# Patient Record
Sex: Male | Born: 1980 | Hispanic: Yes | Smoking: Former smoker
Health system: Southern US, Community
[De-identification: ages and names within clinical notes are randomized; demographics above are authoritative.]

## PROBLEM LIST (undated history)

## (undated) DIAGNOSIS — F1020 Alcohol dependence, uncomplicated: Secondary | ICD-10-CM

---

## 2021-01-29 DIAGNOSIS — Y908 Blood alcohol level of 240 mg/100 ml or more: Secondary | ICD-10-CM | POA: Insufficient documentation

## 2021-01-29 DIAGNOSIS — M542 Cervicalgia: Secondary | ICD-10-CM | POA: Insufficient documentation

## 2021-01-29 DIAGNOSIS — R002 Palpitations: Secondary | ICD-10-CM | POA: Insufficient documentation

## 2021-01-29 DIAGNOSIS — G8929 Other chronic pain: Secondary | ICD-10-CM | POA: Insufficient documentation

## 2021-04-06 DIAGNOSIS — F10920 Alcohol use, unspecified with intoxication, uncomplicated: Secondary | ICD-10-CM | POA: Insufficient documentation

## 2021-04-06 DIAGNOSIS — Z79899 Other long term (current) drug therapy: Secondary | ICD-10-CM | POA: Insufficient documentation

## 2021-04-06 DIAGNOSIS — R079 Chest pain, unspecified: Secondary | ICD-10-CM | POA: Insufficient documentation

## 2021-04-06 DIAGNOSIS — E119 Type 2 diabetes mellitus without complications: Secondary | ICD-10-CM | POA: Insufficient documentation

## 2021-04-06 DIAGNOSIS — E860 Dehydration: Secondary | ICD-10-CM

## 2021-04-06 DIAGNOSIS — Z20822 Contact with and (suspected) exposure to covid-19: Secondary | ICD-10-CM | POA: Insufficient documentation

## 2021-04-06 DIAGNOSIS — R1013 Epigastric pain: Secondary | ICD-10-CM | POA: Insufficient documentation

## 2021-04-06 DIAGNOSIS — Z7984 Long term (current) use of oral hypoglycemic drugs: Secondary | ICD-10-CM | POA: Insufficient documentation

## 2021-04-06 DIAGNOSIS — R630 Anorexia: Secondary | ICD-10-CM | POA: Insufficient documentation

## 2021-04-06 DIAGNOSIS — F1010 Alcohol abuse, uncomplicated: Secondary | ICD-10-CM | POA: Insufficient documentation

## 2021-04-06 DIAGNOSIS — R112 Nausea with vomiting, unspecified: Secondary | ICD-10-CM | POA: Insufficient documentation

## 2021-04-06 DIAGNOSIS — H11433 Conjunctival hyperemia, bilateral: Secondary | ICD-10-CM | POA: Insufficient documentation

## 2021-04-06 DIAGNOSIS — Y908 Blood alcohol level of 240 mg/100 ml or more: Secondary | ICD-10-CM | POA: Insufficient documentation

## 2021-04-06 DIAGNOSIS — R000 Tachycardia, unspecified: Secondary | ICD-10-CM | POA: Insufficient documentation

## 2021-04-06 HISTORY — DX: Alcohol dependence, uncomplicated: F10.20

## 2021-04-06 MED ORDER — ONDANSETRON HCL 4 MG/2ML IJ SOLN
4.0000 mg | Freq: Once | INTRAMUSCULAR | Status: AC
  Administered 2021-04-06: 4 mg via INTRAVENOUS

## 2021-04-06 MED ORDER — DEXTROSE-NACL 5-0.45 % IV SOLN: INTRAVENOUS | Status: AC

## 2021-04-06 MED ORDER — SODIUM CHLORIDE 0.9 % IV BOLUS: 1000.0000 mL | Freq: Once | INTRAVENOUS | Status: DC

## 2021-04-06 MED ADMIN — Famotidine in NaCl 0.9% IV Soln 20 MG/50ML: 20 mg | INTRAVENOUS | NDC 00338519741

## 2021-04-07 MED ORDER — ONDANSETRON HCL 4 MG/2ML IJ SOLN
4.0000 mg | Freq: Once | INTRAMUSCULAR | Status: AC
  Administered 2021-04-07: 4 mg via INTRAVENOUS

## 2021-04-07 MED ORDER — METFORMIN HCL 500 MG PO TABS: 500.0000 mg | ORAL_TABLET | Freq: Two times a day (BID) | ORAL | 0 refills | Status: DC

## 2021-04-07 MED ADMIN — Alum & Mag Hydroxide-Simethicone Susp 200-200-20 MG/5ML: 30 mL | ORAL | NDC 00121176130

## 2021-04-07 MED ADMIN — Sodium Chloride IV Soln 0.9%: 500 mL | INTRAVENOUS | NDC 00338004903

## 2021-07-26 DIAGNOSIS — R0683 Snoring: Secondary | ICD-10-CM | POA: Insufficient documentation

## 2021-08-10 DIAGNOSIS — Z5321 Procedure and treatment not carried out due to patient leaving prior to being seen by health care provider: Secondary | ICD-10-CM | POA: Insufficient documentation

## 2021-08-10 DIAGNOSIS — Z7984 Long term (current) use of oral hypoglycemic drugs: Secondary | ICD-10-CM | POA: Insufficient documentation

## 2021-08-10 DIAGNOSIS — F10920 Alcohol use, unspecified with intoxication, uncomplicated: Secondary | ICD-10-CM

## 2021-08-10 DIAGNOSIS — R079 Chest pain, unspecified: Secondary | ICD-10-CM | POA: Insufficient documentation

## 2021-08-10 DIAGNOSIS — F10120 Alcohol abuse with intoxication, uncomplicated: Secondary | ICD-10-CM | POA: Insufficient documentation

## 2021-08-10 DIAGNOSIS — R519 Headache, unspecified: Secondary | ICD-10-CM | POA: Insufficient documentation

## 2021-08-10 DIAGNOSIS — R109 Unspecified abdominal pain: Secondary | ICD-10-CM | POA: Insufficient documentation

## 2021-08-10 DIAGNOSIS — Y908 Blood alcohol level of 240 mg/100 ml or more: Secondary | ICD-10-CM | POA: Insufficient documentation

## 2021-08-10 DIAGNOSIS — Z79899 Other long term (current) drug therapy: Secondary | ICD-10-CM | POA: Insufficient documentation

## 2021-08-10 DIAGNOSIS — R0789 Other chest pain: Secondary | ICD-10-CM | POA: Insufficient documentation

## 2021-08-10 DIAGNOSIS — E119 Type 2 diabetes mellitus without complications: Secondary | ICD-10-CM | POA: Insufficient documentation

## 2021-08-10 DIAGNOSIS — R000 Tachycardia, unspecified: Secondary | ICD-10-CM | POA: Insufficient documentation

## 2021-08-10 DIAGNOSIS — I10 Essential (primary) hypertension: Secondary | ICD-10-CM | POA: Insufficient documentation

## 2021-08-10 DIAGNOSIS — R45851 Suicidal ideations: Secondary | ICD-10-CM | POA: Insufficient documentation

## 2021-08-10 DIAGNOSIS — F1010 Alcohol abuse, uncomplicated: Secondary | ICD-10-CM

## 2021-08-10 MED ORDER — SODIUM CHLORIDE 0.9 % IV BOLUS
1000.0000 mL | Freq: Once | INTRAVENOUS | Status: AC
  Administered 2021-08-10: 1000 mL via INTRAVENOUS

## 2021-08-10 MED ORDER — CHLORDIAZEPOXIDE HCL 25 MG PO CAPS: ORAL_CAPSULE | ORAL | 0 refills | Status: DC

## 2021-08-11 DIAGNOSIS — Y907 Blood alcohol level of 200-239 mg/100 ml: Secondary | ICD-10-CM | POA: Insufficient documentation

## 2021-08-11 DIAGNOSIS — F10120 Alcohol abuse with intoxication, uncomplicated: Secondary | ICD-10-CM | POA: Insufficient documentation

## 2021-08-11 DIAGNOSIS — F10920 Alcohol use, unspecified with intoxication, uncomplicated: Secondary | ICD-10-CM | POA: Insufficient documentation

## 2021-08-11 DIAGNOSIS — Z79899 Other long term (current) drug therapy: Secondary | ICD-10-CM | POA: Insufficient documentation

## 2021-08-11 DIAGNOSIS — Y908 Blood alcohol level of 240 mg/100 ml or more: Secondary | ICD-10-CM | POA: Insufficient documentation

## 2021-08-11 DIAGNOSIS — F32A Depression, unspecified: Secondary | ICD-10-CM | POA: Insufficient documentation

## 2021-08-11 DIAGNOSIS — Z87891 Personal history of nicotine dependence: Secondary | ICD-10-CM | POA: Insufficient documentation

## 2021-08-11 DIAGNOSIS — F1010 Alcohol abuse, uncomplicated: Secondary | ICD-10-CM

## 2021-08-11 DIAGNOSIS — Z7984 Long term (current) use of oral hypoglycemic drugs: Secondary | ICD-10-CM | POA: Insufficient documentation

## 2021-08-11 MED ORDER — ADULT MULTIVITAMIN W/MINERALS CH
1.0000 | ORAL_TABLET | Freq: Once | ORAL | Status: AC
  Administered 2021-08-11: 1 via ORAL

## 2021-08-11 MED ORDER — SODIUM CHLORIDE 0.9 % IV BOLUS
1000.0000 mL | Freq: Once | INTRAVENOUS | Status: AC
  Administered 2021-08-11: 1000 mL via INTRAVENOUS

## 2021-08-11 MED ORDER — ONDANSETRON 4 MG PO TBDP
8.0000 mg | ORAL_TABLET | Freq: Once | ORAL | Status: AC
  Administered 2021-08-11: 8 mg via ORAL

## 2021-08-11 MED ORDER — THIAMINE HCL 100 MG/ML IJ SOLN
100.0000 mg | Freq: Once | INTRAMUSCULAR | Status: AC
  Administered 2021-08-11: 100 mg via INTRAVENOUS

## 2021-08-11 MED ORDER — PANTOPRAZOLE SODIUM 40 MG IV SOLR
40.0000 mg | Freq: Once | INTRAVENOUS | Status: AC
  Administered 2021-08-11: 40 mg via INTRAVENOUS

## 2021-08-11 MED ORDER — ONDANSETRON HCL 4 MG/2ML IJ SOLN
4.0000 mg | Freq: Once | INTRAMUSCULAR | Status: AC
  Administered 2021-08-11: 4 mg via INTRAVENOUS

## 2021-10-16 DIAGNOSIS — G4733 Obstructive sleep apnea (adult) (pediatric): Secondary | ICD-10-CM | POA: Insufficient documentation

## 2021-10-16 DIAGNOSIS — R0683 Snoring: Secondary | ICD-10-CM | POA: Insufficient documentation

## 2021-10-24 DIAGNOSIS — R0683 Snoring: Secondary | ICD-10-CM

## 2021-11-02 DIAGNOSIS — G4733 Obstructive sleep apnea (adult) (pediatric): Secondary | ICD-10-CM | POA: Insufficient documentation

## 2021-11-02 DIAGNOSIS — Z7984 Long term (current) use of oral hypoglycemic drugs: Secondary | ICD-10-CM | POA: Insufficient documentation

## 2021-11-02 DIAGNOSIS — N50812 Left testicular pain: Secondary | ICD-10-CM | POA: Insufficient documentation

## 2021-11-02 DIAGNOSIS — E119 Type 2 diabetes mellitus without complications: Secondary | ICD-10-CM | POA: Insufficient documentation

## 2021-11-02 MED ORDER — HYDROCODONE-ACETAMINOPHEN 5-325 MG PO TABS: 1.0000 | ORAL_TABLET | ORAL | 0 refills | Status: AC | PRN

## 2022-01-02 DIAGNOSIS — G4734 Idiopathic sleep related nonobstructive alveolar hypoventilation: Secondary | ICD-10-CM | POA: Insufficient documentation

## 2022-01-02 DIAGNOSIS — G4733 Obstructive sleep apnea (adult) (pediatric): Secondary | ICD-10-CM

## 2022-06-28 DIAGNOSIS — G4733 Obstructive sleep apnea (adult) (pediatric): Secondary | ICD-10-CM

## 2022-06-28 DIAGNOSIS — G4734 Idiopathic sleep related nonobstructive alveolar hypoventilation: Secondary | ICD-10-CM

## 2022-07-16 DIAGNOSIS — G4733 Obstructive sleep apnea (adult) (pediatric): Secondary | ICD-10-CM

## 2022-07-16 DIAGNOSIS — G4734 Idiopathic sleep related nonobstructive alveolar hypoventilation: Secondary | ICD-10-CM

## 2022-07-18 DIAGNOSIS — G4733 Obstructive sleep apnea (adult) (pediatric): Secondary | ICD-10-CM | POA: Insufficient documentation

## 2022-07-18 DIAGNOSIS — G4734 Idiopathic sleep related nonobstructive alveolar hypoventilation: Secondary | ICD-10-CM | POA: Insufficient documentation

## 2022-07-20 DIAGNOSIS — G4733 Obstructive sleep apnea (adult) (pediatric): Secondary | ICD-10-CM

## 2023-01-15 DIAGNOSIS — G4734 Idiopathic sleep related nonobstructive alveolar hypoventilation: Secondary | ICD-10-CM

## 2023-01-15 DIAGNOSIS — J45909 Unspecified asthma, uncomplicated: Secondary | ICD-10-CM | POA: Insufficient documentation

## 2023-01-15 DIAGNOSIS — G4733 Obstructive sleep apnea (adult) (pediatric): Secondary | ICD-10-CM

## 2023-01-15 MED ORDER — ALBUTEROL SULFATE HFA 108 (90 BASE) MCG/ACT IN AERS: 2.0000 | INHALATION_SPRAY | Freq: Four times a day (QID) | RESPIRATORY_TRACT | 2 refills | Status: AC | PRN

## 2023-01-15 MED ORDER — LEVOCETIRIZINE DIHYDROCHLORIDE 5 MG PO TABS: 5.0000 mg | ORAL_TABLET | Freq: Every evening | ORAL | 5 refills | Status: DC

## 2023-03-06 DIAGNOSIS — K2920 Alcoholic gastritis without bleeding: Secondary | ICD-10-CM | POA: Insufficient documentation

## 2023-03-06 DIAGNOSIS — Z87891 Personal history of nicotine dependence: Secondary | ICD-10-CM | POA: Insufficient documentation

## 2023-03-06 DIAGNOSIS — F1010 Alcohol abuse, uncomplicated: Secondary | ICD-10-CM | POA: Insufficient documentation

## 2023-03-06 DIAGNOSIS — Y902 Blood alcohol level of 40-59 mg/100 ml: Secondary | ICD-10-CM | POA: Insufficient documentation

## 2023-03-06 DIAGNOSIS — J45909 Unspecified asthma, uncomplicated: Secondary | ICD-10-CM | POA: Insufficient documentation

## 2023-03-06 MED ORDER — PANTOPRAZOLE SODIUM 20 MG PO TBEC: 20.0000 mg | DELAYED_RELEASE_TABLET | Freq: Every day | ORAL | 0 refills | Status: AC

## 2023-03-06 MED ORDER — NALTREXONE HCL 50 MG PO TABS: 50.0000 mg | ORAL_TABLET | Freq: Every day | ORAL | 0 refills | Status: AC

## 2023-03-06 MED ORDER — PHENOBARBITAL 16.2 MG PO TABS
16.2000 mg | ORAL_TABLET | Freq: Once | ORAL | Status: AC
  Administered 2023-03-07: 16.2 mg via ORAL

## 2023-03-06 MED ADMIN — Alum & Mag Hydroxide-Simethicone Susp 200-200-20 MG/5ML: 15 mL | ORAL | NDC 63739015910

## 2023-03-06 MED ADMIN — Pantoprazole Sodium EC Tab 40 MG (Base Equiv): 40 mg | ORAL | NDC 35573042851

## 2023-06-15 IMAGING — DX DG CHEST 1V PORT
1 series · 1 of 1 positions shown · non-contrast
Comparison: Chest radiographs 01/29/2021.

CLINICAL DATA: 40-year-old male with chest pain.

EXAM:
PORTABLE CHEST 1 VIEW

[chest]
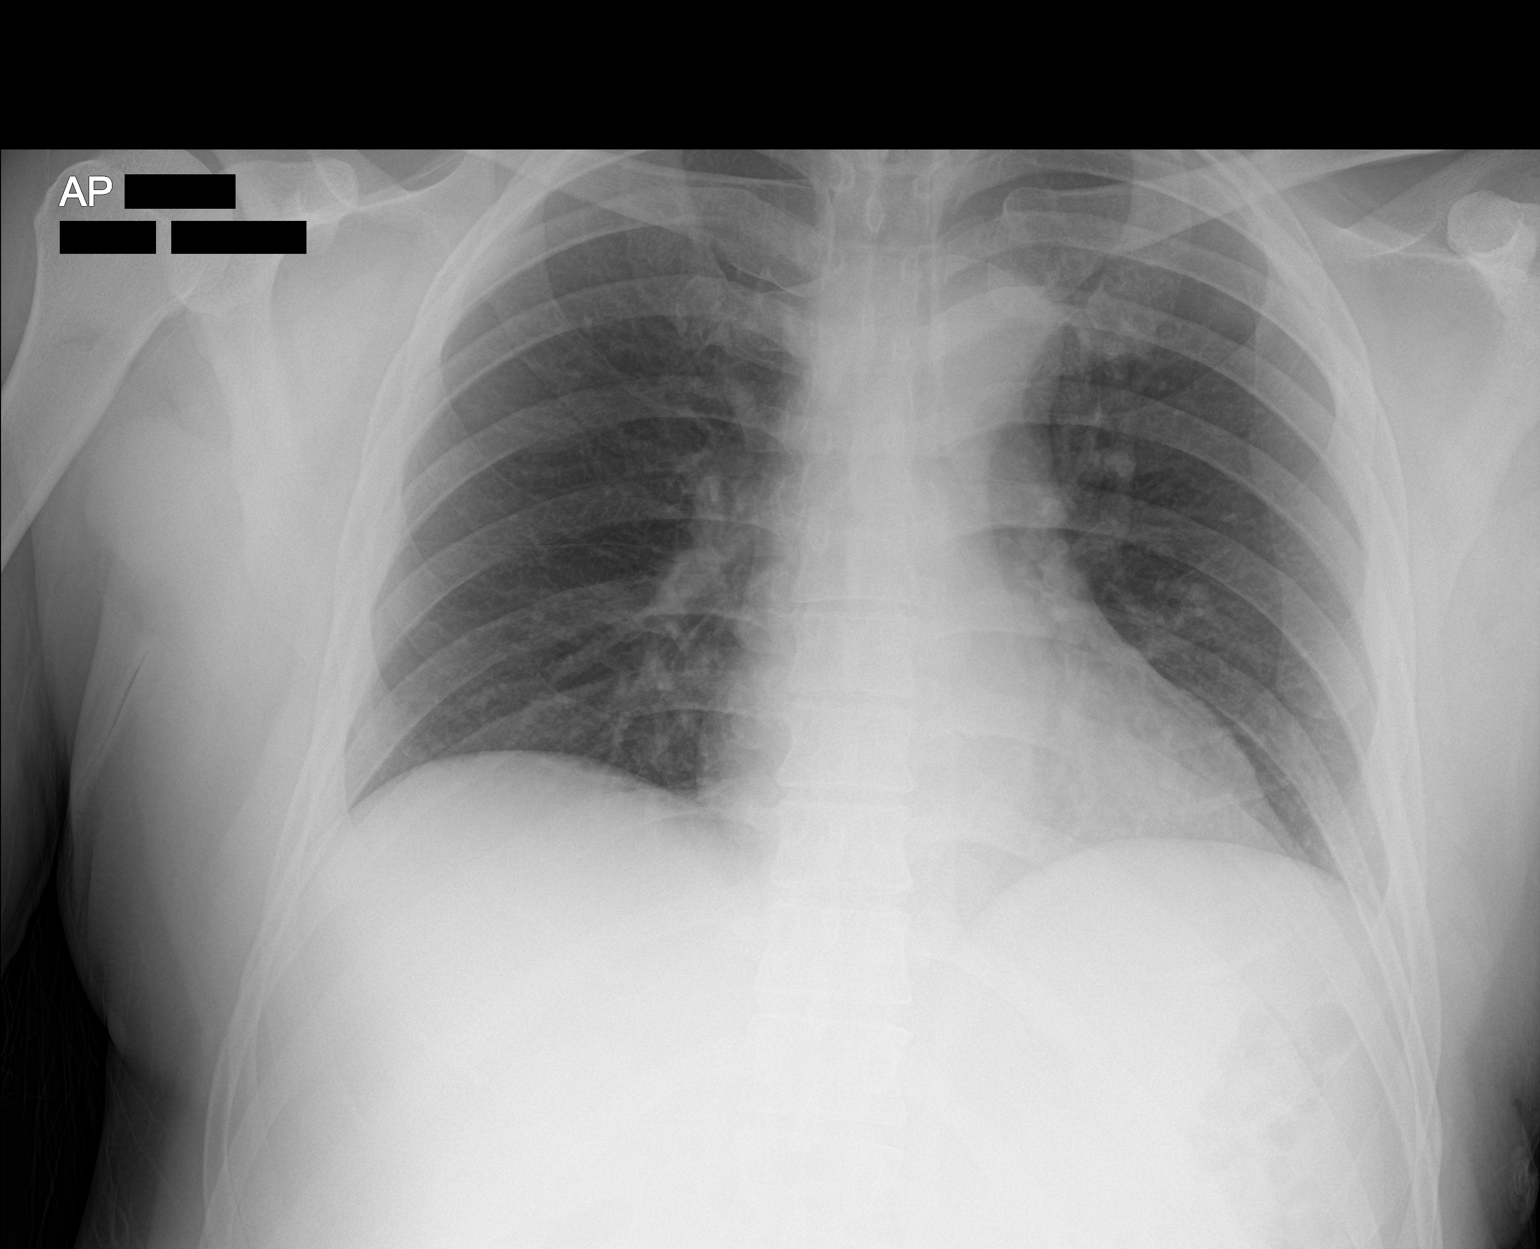

[1 of 1 positions shown; findings below may reference images not displayed]

FINDINGS: Portable AP semi upright view at [id number] hours. Low normal lung
volumes. Normal cardiac size and mediastinal contours. Visualized
tracheal air column is within normal limits. Allowing for portable
technique the lungs are clear. No pneumothorax or pleural effusion.
No osseous abnormality identified. Negative visible bowel gas.
IMPRESSION: Negative portable chest.

## 2023-06-16 IMAGING — DX DG CHEST 1V PORT
1 series · 1 of 1 positions shown · non-contrast
Comparison: April 06, 2021

CLINICAL DATA: Chest pain.

EXAM:
PORTABLE CHEST 1 VIEW

[chest ap]
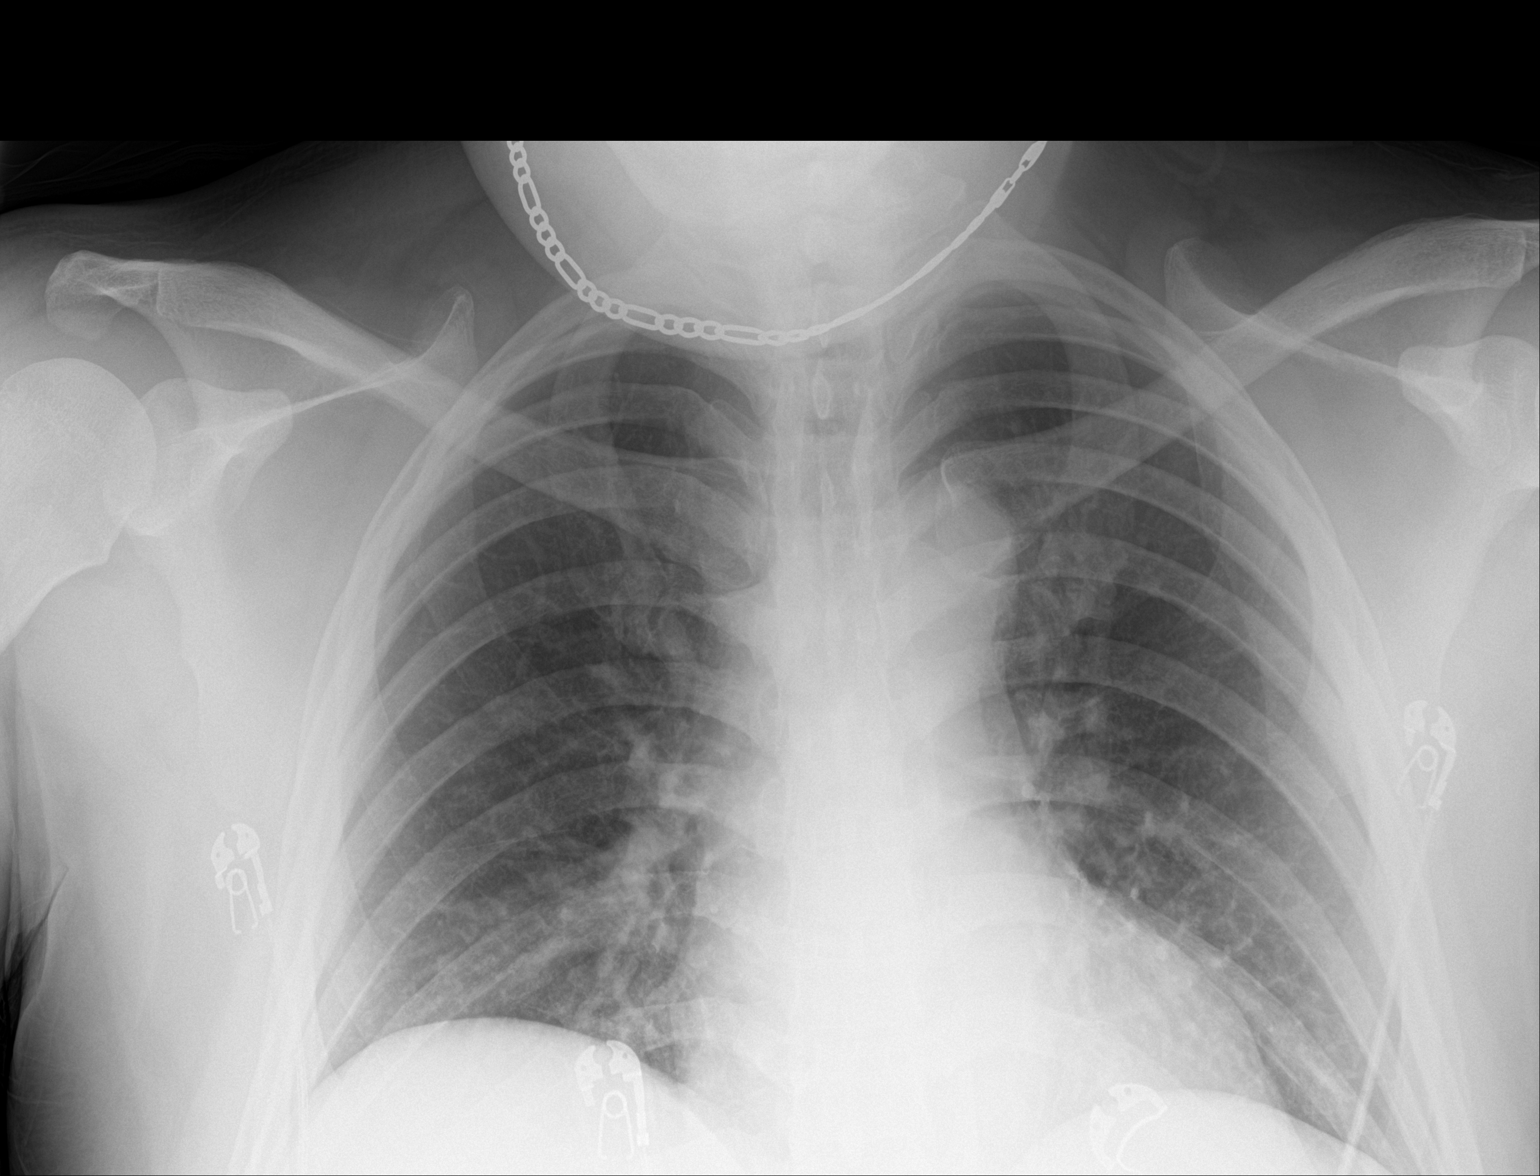

[1 of 1 positions shown; findings below may reference images not displayed]

FINDINGS: The heart size and mediastinal contours are within normal limits.
Low lung volumes are seen. Both lungs are clear. The visualized
skeletal structures are unremarkable.
IMPRESSION: No active disease.

## 2023-07-16 DIAGNOSIS — R430 Anosmia: Secondary | ICD-10-CM

## 2023-07-16 DIAGNOSIS — R0981 Nasal congestion: Secondary | ICD-10-CM

## 2023-07-16 DIAGNOSIS — Z87891 Personal history of nicotine dependence: Secondary | ICD-10-CM

## 2023-07-16 DIAGNOSIS — H9191 Unspecified hearing loss, right ear: Secondary | ICD-10-CM

## 2023-07-16 DIAGNOSIS — G4733 Obstructive sleep apnea (adult) (pediatric): Secondary | ICD-10-CM

## 2023-07-16 DIAGNOSIS — J310 Chronic rhinitis: Secondary | ICD-10-CM

## 2023-07-16 DIAGNOSIS — U099 Post covid-19 condition, unspecified: Secondary | ICD-10-CM

## 2023-07-16 MED ORDER — FLUTICASONE PROPIONATE 50 MCG/ACT NA SUSP: 1.0000 | Freq: Every day | NASAL | 2 refills | Status: DC

## 2023-11-26 DIAGNOSIS — J343 Hypertrophy of nasal turbinates: Secondary | ICD-10-CM

## 2023-11-26 DIAGNOSIS — H6993 Unspecified Eustachian tube disorder, bilateral: Secondary | ICD-10-CM

## 2023-11-26 DIAGNOSIS — R0981 Nasal congestion: Secondary | ICD-10-CM

## 2023-11-26 DIAGNOSIS — R438 Other disturbances of smell and taste: Secondary | ICD-10-CM

## 2023-11-26 DIAGNOSIS — J3489 Other specified disorders of nose and nasal sinuses: Secondary | ICD-10-CM

## 2023-11-26 DIAGNOSIS — G4733 Obstructive sleep apnea (adult) (pediatric): Secondary | ICD-10-CM

## 2023-11-26 DIAGNOSIS — Z87891 Personal history of nicotine dependence: Secondary | ICD-10-CM

## 2023-11-26 MED ORDER — FLUTICASONE PROPIONATE 50 MCG/ACT NA SUSP: 2.0000 | Freq: Two times a day (BID) | NASAL | 2 refills | Status: AC

## 2023-11-26 MED ORDER — AZELASTINE HCL 0.1 % NA SOLN: 2.0000 | Freq: Two times a day (BID) | NASAL | 12 refills | Status: AC
